# Patient Record
Sex: Female | Born: 1950 | Race: Black or African American | Hispanic: No | Smoking: Never smoker
Health system: Southern US, Community
[De-identification: ages and names within clinical notes are randomized; demographics above are authoritative.]

## PROBLEM LIST (undated history)

## (undated) DIAGNOSIS — I10 Essential (primary) hypertension: Secondary | ICD-10-CM

## (undated) DIAGNOSIS — E119 Type 2 diabetes mellitus without complications: Secondary | ICD-10-CM

---

## 2006-04-26 ENCOUNTER — Inpatient Hospital Stay (HOSPITAL_COMMUNITY): Admission: RE | Admit: 2006-04-26 | Discharge: 2006-04-30 | Payer: Self-pay | Admitting: Orthopedic Surgery

## 2007-11-02 IMAGING — CR DG HIP (WITH OR WITHOUT PELVIS) 2-3V*L*
4 series · 4 of 4 positions shown · non-contrast
Comparison: None. Progress examination left hip, 2 views.

HISTORY: Osteoarthritis left hip.

[t pelvis a.p.]
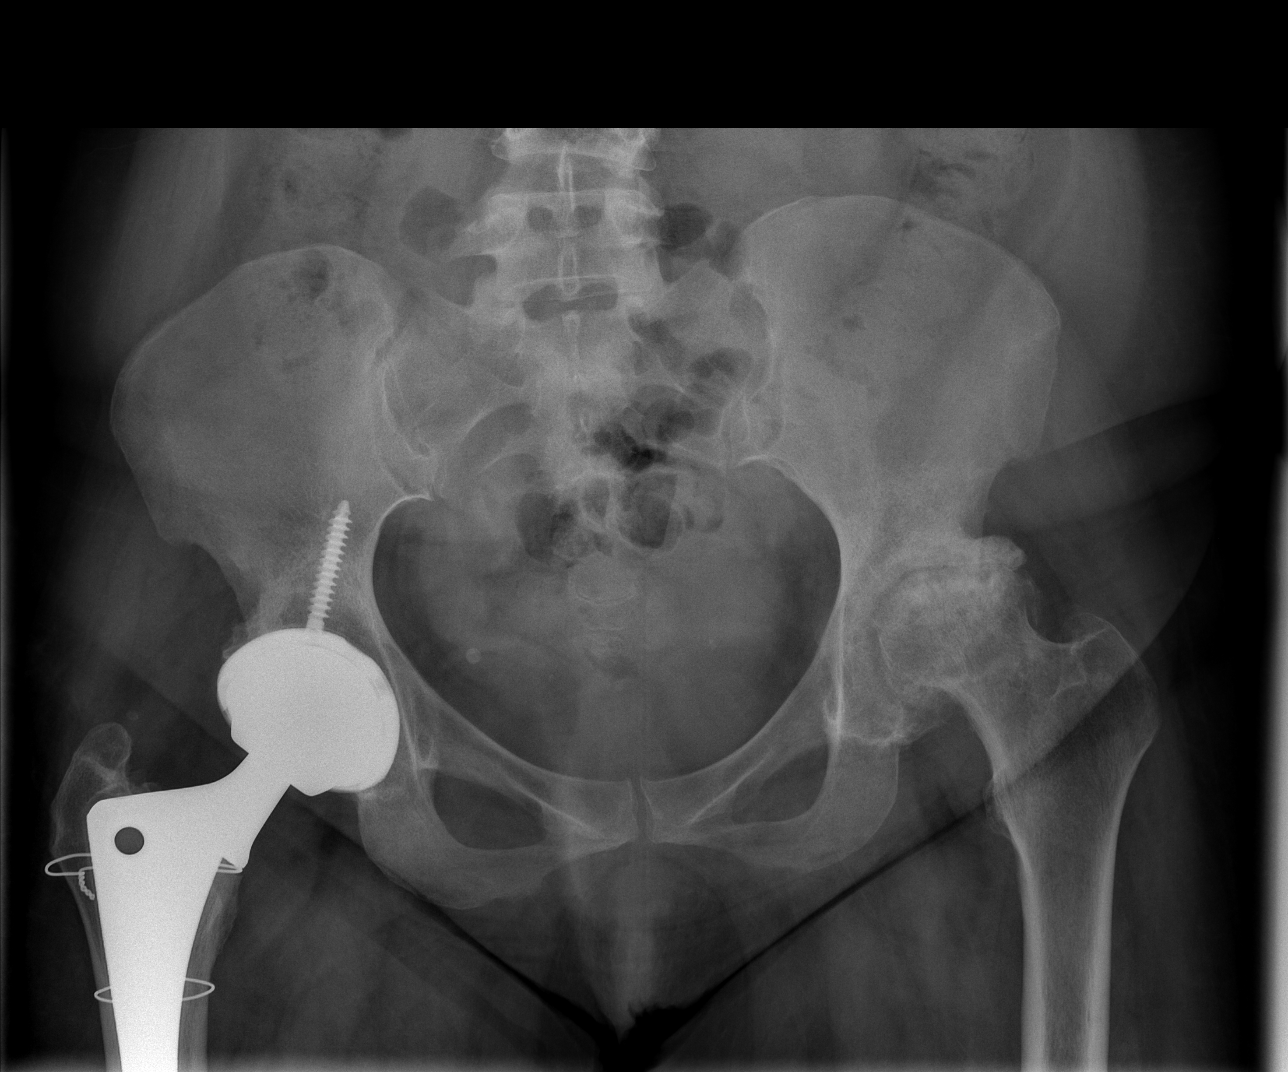

[t hip ap left]
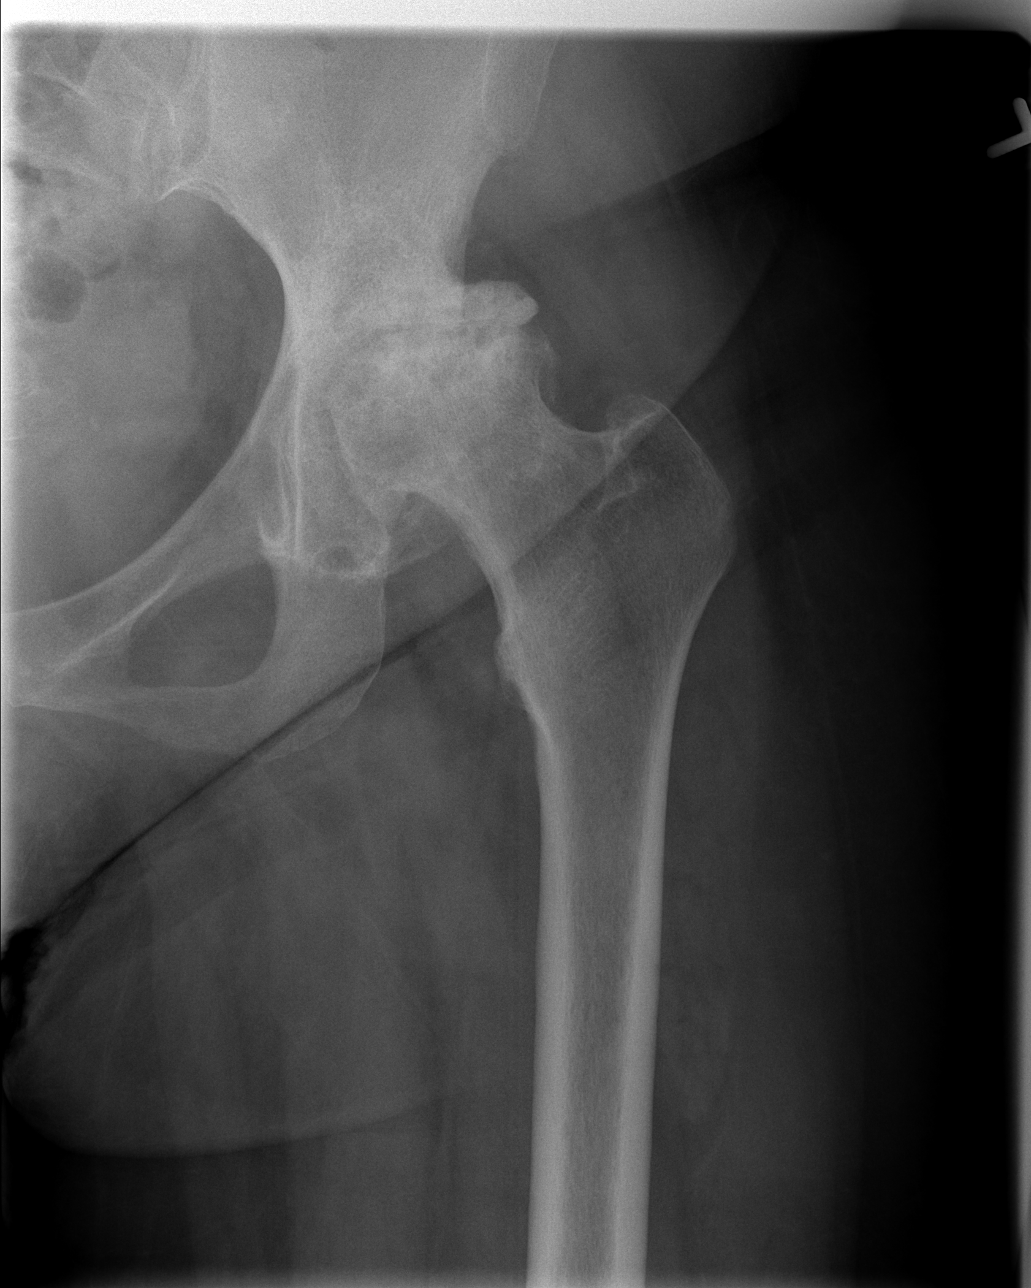

[t hip frog leg left (1 of 2)]
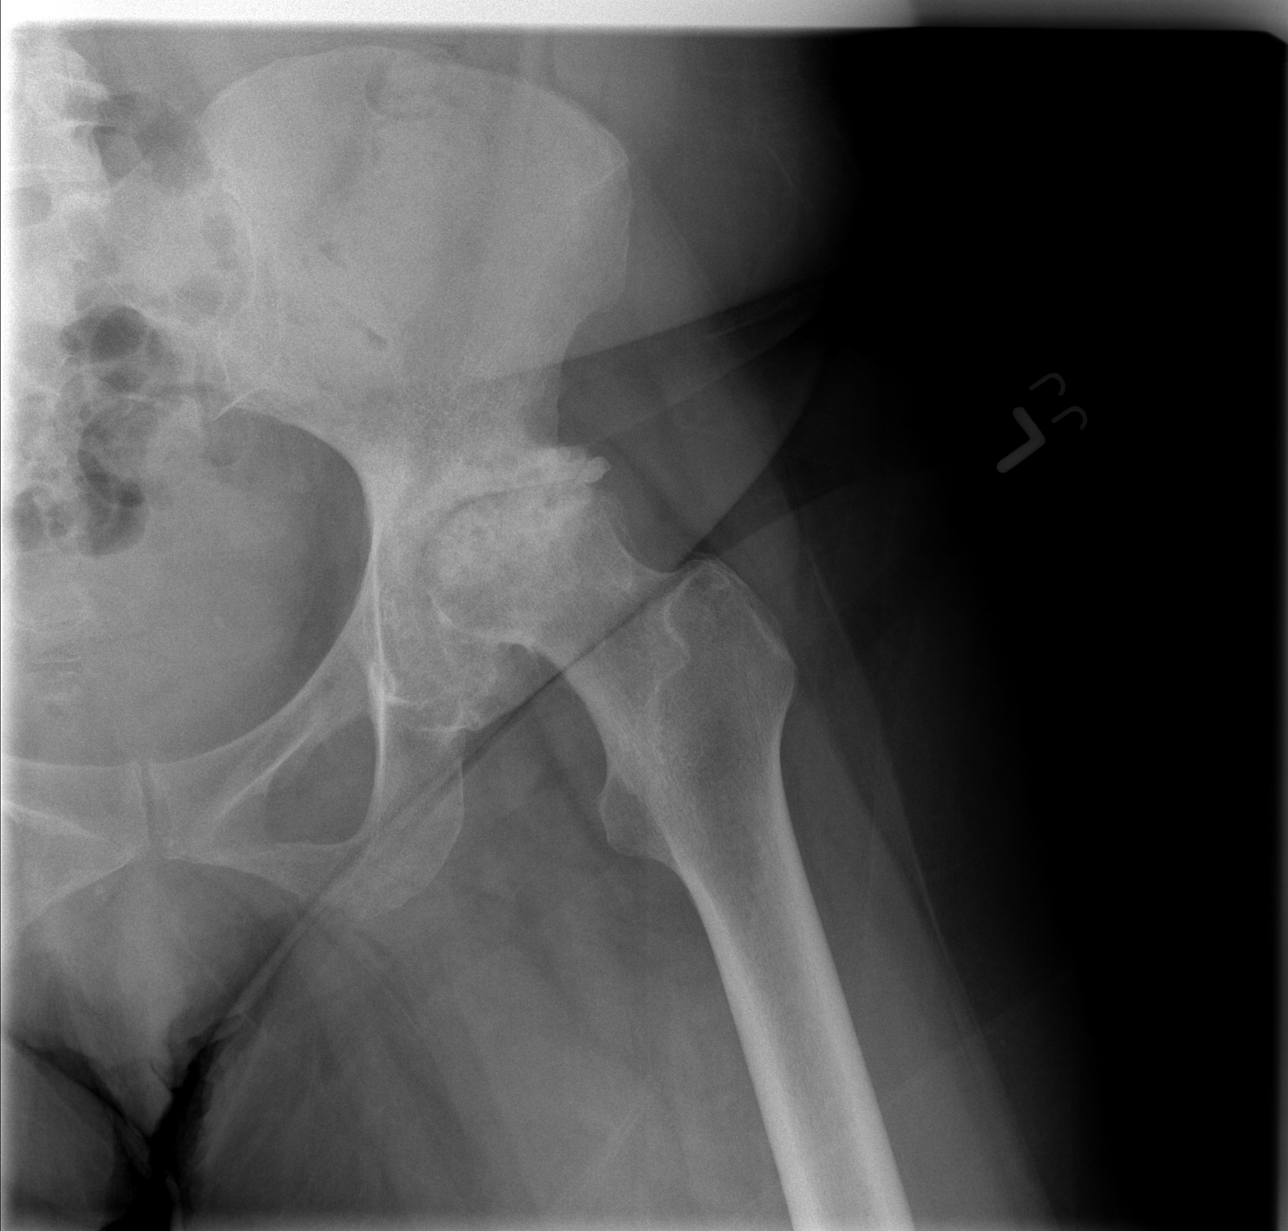

[t hip frog leg left (2 of 2)]
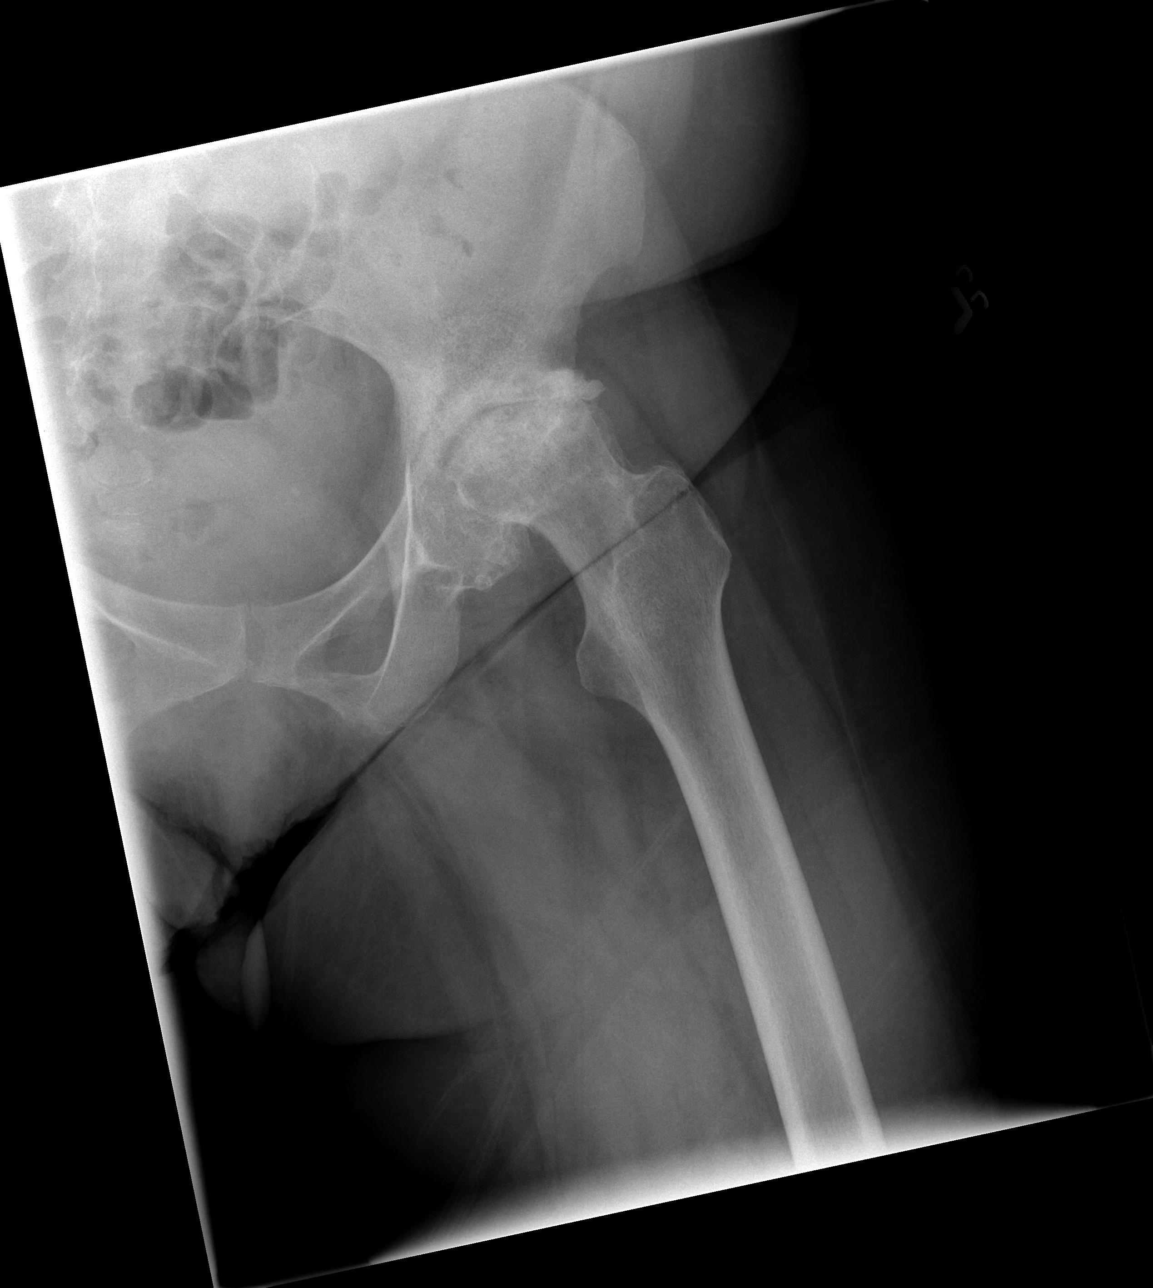

[4 of 4 positions shown; findings below may reference images not displayed]

FINDINGS: Severe degenerative joint disease is identified involving the left hip. There is
marked deformation of the left femoral head, with exuberant osteophyte
formation,  subchondral sclerosis and subchondral geode formation.

A right total hip arthroplasty device is noted. 

No complications are noted.
IMPRESSION: 1. Severe degenerative joint disease affects the left hip.

## 2007-11-09 IMAGING — CR DG PORTABLE PELVIS
1 series · 1 of 1 positions shown · non-contrast
Comparison: none

CLINICAL DATA: Total hip replacement.
 PORTABLE PELVIS ? 1 VIEW:

[view not recorded]
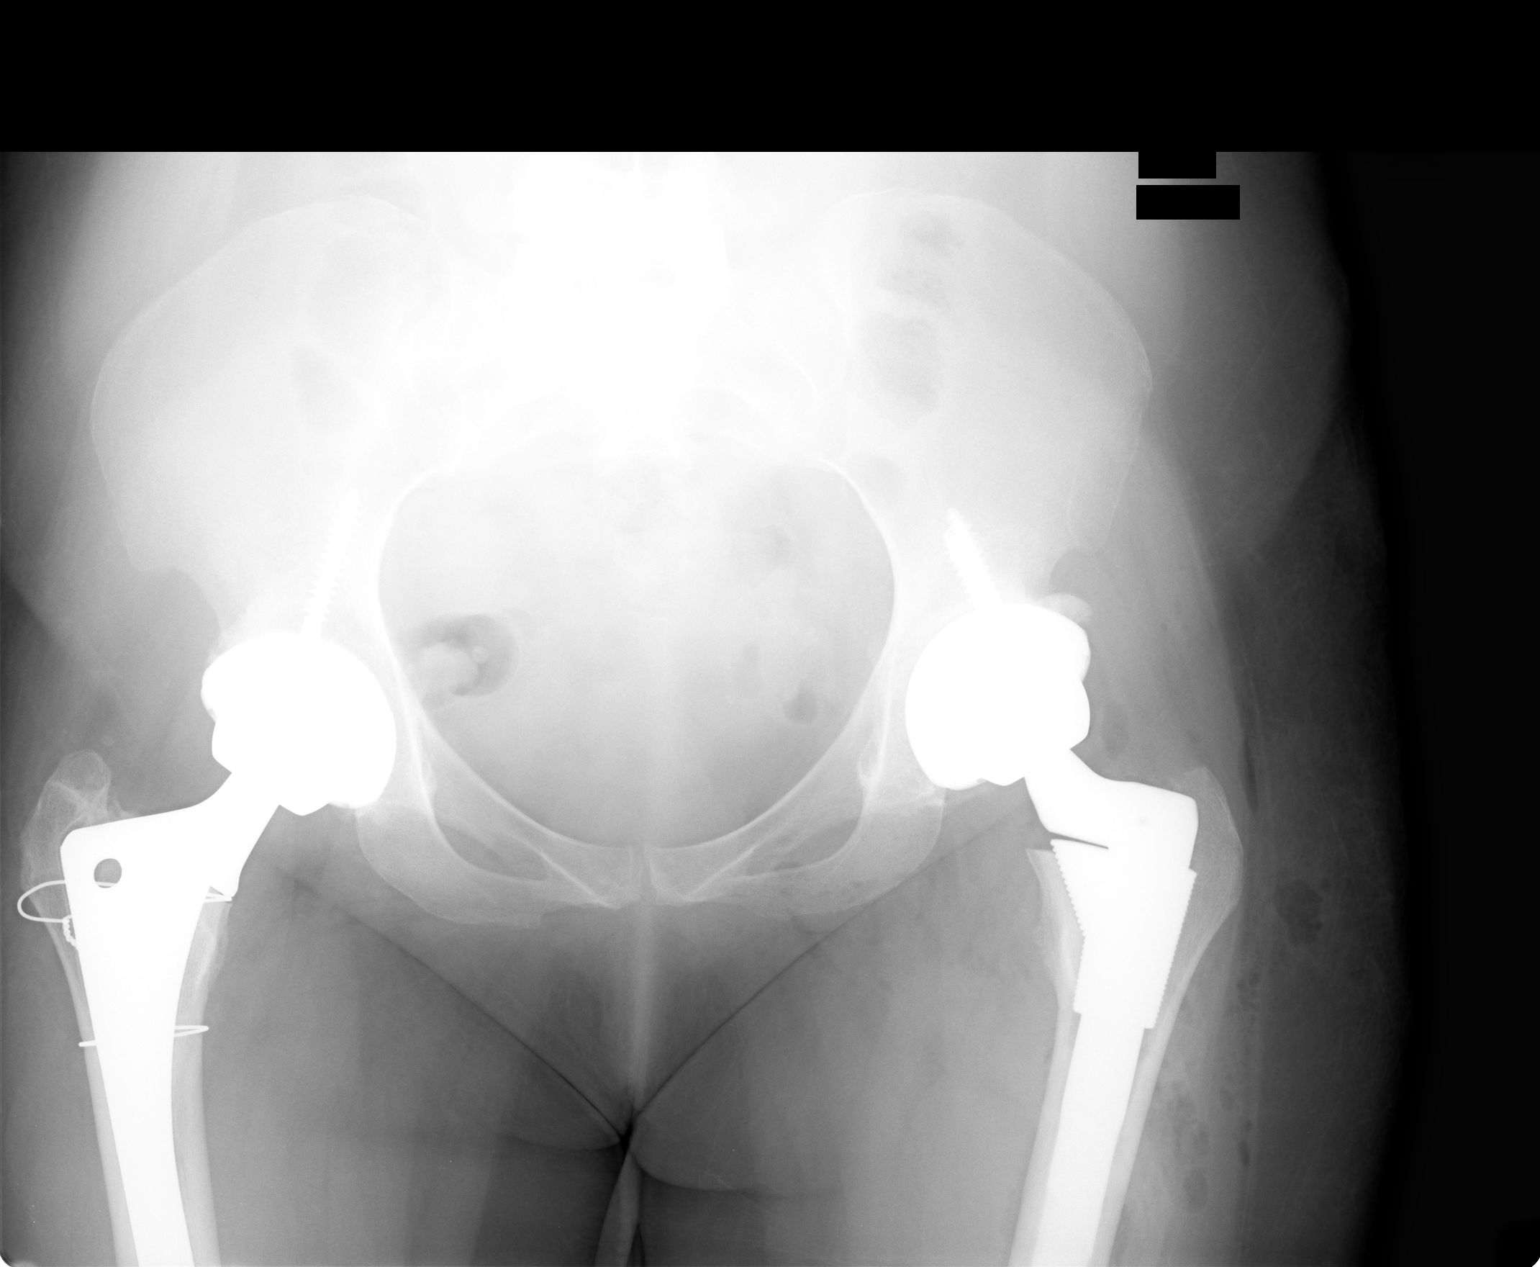

[1 of 1 positions shown; findings below may reference images not displayed]

FINDINGS: AP view of the pelvis reveals satisfactory position of the acetabulum and femoral prosthesis.
IMPRESSION: Satisfactory left total hip replacement.  
 PORTABLE LEFT HIP ? 1 VIEW:
FINDINGS: There is satisfactory position of the left acetabulum and femoral prosthesis.
IMPRESSION: Satisfactory left total hip replacement.

## 2007-11-09 IMAGING — CR DG HIP 1V PORT*L*
1 series · 1 of 1 positions shown · non-contrast
Comparison: none

CLINICAL DATA: Total hip replacement.
 PORTABLE PELVIS ? 1 VIEW:

[view not recorded]
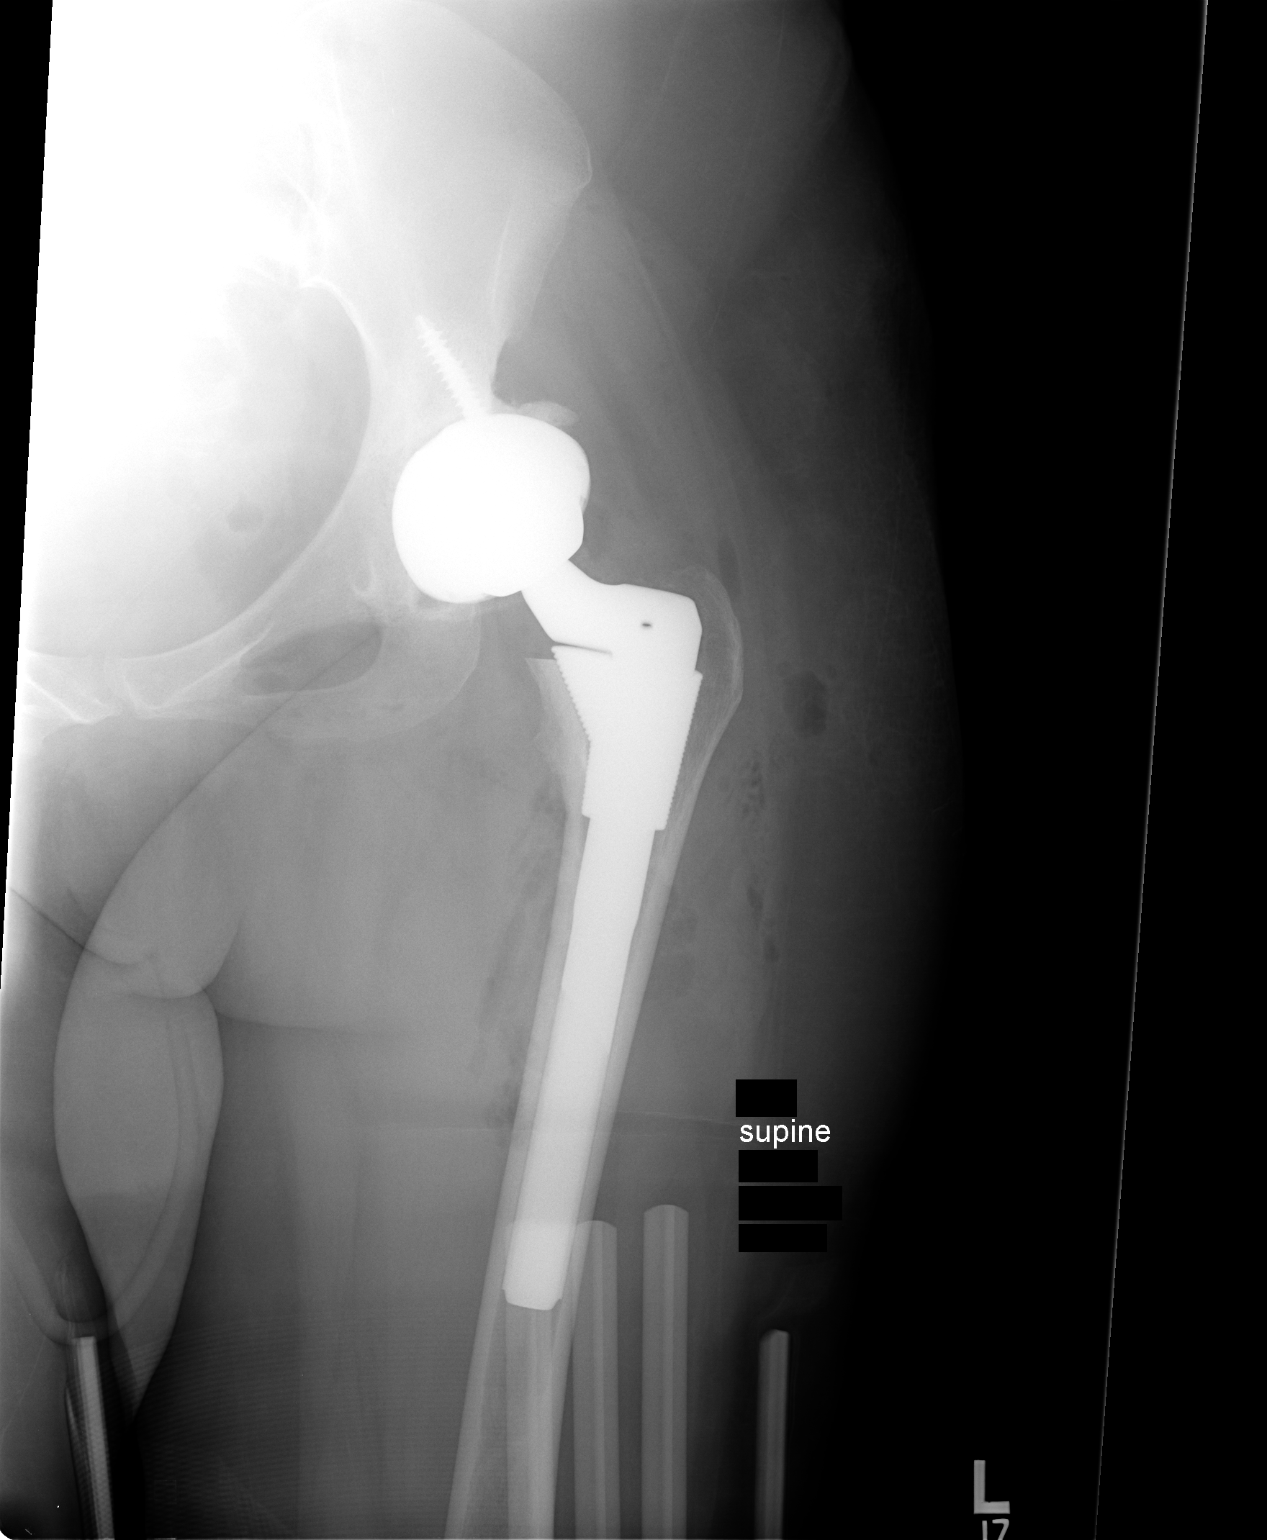

[1 of 1 positions shown; findings below may reference images not displayed]

FINDINGS: AP view of the pelvis reveals satisfactory position of the acetabulum and femoral prosthesis.
IMPRESSION: Satisfactory left total hip replacement.  
 PORTABLE LEFT HIP ? 1 VIEW:
FINDINGS: There is satisfactory position of the left acetabulum and femoral prosthesis.
IMPRESSION: Satisfactory left total hip replacement.

## 2022-01-30 ENCOUNTER — Encounter (HOSPITAL_COMMUNITY): Payer: Self-pay | Admitting: *Deleted

## 2022-01-30 ENCOUNTER — Other Ambulatory Visit: Payer: Self-pay

## 2022-01-30 ENCOUNTER — Emergency Department (HOSPITAL_COMMUNITY): Payer: No Typology Code available for payment source

## 2022-01-30 ENCOUNTER — Observation Stay (HOSPITAL_COMMUNITY)
Admission: EM | Admit: 2022-01-30 | Discharge: 2022-02-02 | Disposition: A | Discharge status: Home / Self Care | Payer: No Typology Code available for payment source | Attending: General Surgery | Admitting: General Surgery

## 2022-01-30 DIAGNOSIS — R0789 Other chest pain: Secondary | ICD-10-CM | POA: Diagnosis present

## 2022-01-30 DIAGNOSIS — R410 Disorientation, unspecified: Secondary | ICD-10-CM | POA: Insufficient documentation

## 2022-01-30 DIAGNOSIS — Y92410 Unspecified street and highway as the place of occurrence of the external cause: Secondary | ICD-10-CM | POA: Insufficient documentation

## 2022-01-30 DIAGNOSIS — S270XXA Traumatic pneumothorax, initial encounter: Secondary | ICD-10-CM | POA: Diagnosis not present

## 2022-01-30 DIAGNOSIS — E039 Hypothyroidism, unspecified: Secondary | ICD-10-CM | POA: Insufficient documentation

## 2022-01-30 DIAGNOSIS — I700 Atherosclerosis of aorta: Secondary | ICD-10-CM | POA: Insufficient documentation

## 2022-01-30 DIAGNOSIS — I10 Essential (primary) hypertension: Secondary | ICD-10-CM | POA: Diagnosis not present

## 2022-01-30 DIAGNOSIS — E119 Type 2 diabetes mellitus without complications: Secondary | ICD-10-CM | POA: Insufficient documentation

## 2022-01-30 DIAGNOSIS — T797XXA Traumatic subcutaneous emphysema, initial encounter: Secondary | ICD-10-CM | POA: Insufficient documentation

## 2022-01-30 DIAGNOSIS — S032XXA Dislocation of tooth, initial encounter: Secondary | ICD-10-CM | POA: Diagnosis not present

## 2022-01-30 DIAGNOSIS — S0993XA Unspecified injury of face, initial encounter: Secondary | ICD-10-CM

## 2022-01-30 DIAGNOSIS — Z79899 Other long term (current) drug therapy: Secondary | ICD-10-CM | POA: Diagnosis not present

## 2022-01-30 DIAGNOSIS — S01512A Laceration without foreign body of oral cavity, initial encounter: Secondary | ICD-10-CM | POA: Diagnosis not present

## 2022-01-30 DIAGNOSIS — S0101XA Laceration without foreign body of scalp, initial encounter: Secondary | ICD-10-CM

## 2022-01-30 DIAGNOSIS — J939 Pneumothorax, unspecified: Secondary | ICD-10-CM | POA: Diagnosis present

## 2022-01-30 DIAGNOSIS — Z7984 Long term (current) use of oral hypoglycemic drugs: Secondary | ICD-10-CM | POA: Diagnosis not present

## 2022-01-30 DIAGNOSIS — R262 Difficulty in walking, not elsewhere classified: Secondary | ICD-10-CM | POA: Insufficient documentation

## 2022-01-30 DIAGNOSIS — J982 Interstitial emphysema: Secondary | ICD-10-CM

## 2022-01-30 HISTORY — DX: Type 2 diabetes mellitus without complications: E11.9

## 2022-01-30 HISTORY — DX: Essential (primary) hypertension: I10

## 2022-01-30 LAB — COMPREHENSIVE METABOLIC PANEL
ALT: 63 U/L — ABNORMAL HIGH (ref 0–44)
AST: 70 U/L — ABNORMAL HIGH (ref 15–41)
Albumin: 3.9 g/dL (ref 3.5–5.0)
Alkaline Phosphatase: 59 U/L (ref 38–126)
Anion gap: 8 (ref 5–15)
BUN: 26 mg/dL — ABNORMAL HIGH (ref 8–23)
CO2: 22 mmol/L (ref 22–32)
Calcium: 9.6 mg/dL (ref 8.9–10.3)
Chloride: 107 mmol/L (ref 98–111)
Creatinine, Ser: 1.33 mg/dL — ABNORMAL HIGH (ref 0.44–1.00)
GFR, Estimated: 43 mL/min — ABNORMAL LOW (ref >60)
Glucose, Bld: 153 mg/dL — ABNORMAL HIGH (ref 70–99)
Potassium: 4 mmol/L (ref 3.5–5.1)
Sodium: 137 mmol/L (ref 135–145)
Total Bilirubin: 0.3 mg/dL (ref 0.3–1.2)
Total Protein: 6.9 g/dL (ref 6.5–8.1)

## 2022-01-30 LAB — I-STAT CHEM 8, ED
BUN: 27 mg/dL — ABNORMAL HIGH (ref 8–23)
Calcium, Ion: 1.23 mmol/L (ref 1.15–1.40)
Chloride: 107 mmol/L (ref 98–111)
Creatinine, Ser: 1.3 mg/dL — ABNORMAL HIGH (ref 0.44–1.00)
Glucose, Bld: 156 mg/dL — ABNORMAL HIGH (ref 70–99)
HCT: 30 % — ABNORMAL LOW (ref 36.0–46.0)
Hemoglobin: 10.2 g/dL — ABNORMAL LOW (ref 12.0–15.0)
Potassium: 4 mmol/L (ref 3.5–5.1)
Sodium: 141 mmol/L (ref 135–145)
TCO2: 21 mmol/L — ABNORMAL LOW (ref 22–32)

## 2022-01-30 LAB — GLUCOSE, CAPILLARY
Glucose-Capillary: 128 mg/dL — ABNORMAL HIGH (ref 70–99)
Glucose-Capillary: 153 mg/dL — ABNORMAL HIGH (ref 70–99)
Glucose-Capillary: 158 mg/dL — ABNORMAL HIGH (ref 70–99)

## 2022-01-30 LAB — PROTIME-INR
INR: 1 (ref 0.8–1.2)
Prothrombin Time: 12.5 seconds (ref 11.4–15.2)

## 2022-01-30 LAB — CBC
HCT: 32.8 % — ABNORMAL LOW (ref 36.0–46.0)
Hemoglobin: 11 g/dL — ABNORMAL LOW (ref 12.0–15.0)
MCH: 28.8 pg (ref 26.0–34.0)
MCHC: 33.5 g/dL (ref 30.0–36.0)
MCV: 85.9 fL (ref 80.0–100.0)
Platelets: 200 10*3/uL (ref 150–400)
RBC: 3.82 MIL/uL — ABNORMAL LOW (ref 3.87–5.11)
RDW: 13.9 % (ref 11.5–15.5)
WBC: 10.6 10*3/uL — ABNORMAL HIGH (ref 4.0–10.5)
nRBC: 0 % (ref 0.0–0.2)

## 2022-01-30 LAB — ETHANOL: Alcohol, Ethyl (B): <10 mg/dL (ref <10)

## 2022-01-30 LAB — CBG MONITORING, ED: Glucose-Capillary: 159 mg/dL — ABNORMAL HIGH (ref 70–99)

## 2022-01-30 LAB — LACTIC ACID, PLASMA: Lactic Acid, Venous: 2.3 mmol/L (ref 0.5–1.9)

## 2022-01-30 LAB — SAMPLE TO BLOOD BANK

## 2022-01-30 MED ORDER — OXYCODONE HCL 5 MG PO TABS
2.5000 mg | ORAL_TABLET | ORAL | Status: DC | PRN
  Administered 2022-01-31 – 2022-02-02 (×8): 5 mg via ORAL
  Filled 2022-01-30 (×8): qty 1

## 2022-01-30 MED ORDER — SODIUM CHLORIDE 0.9% FLUSH: 3.0000 mL | INTRAVENOUS | Status: DC | PRN

## 2022-01-30 MED ORDER — HYDRALAZINE HCL 20 MG/ML IJ SOLN: 10.0000 mg | INTRAMUSCULAR | Status: DC | PRN

## 2022-01-30 MED ORDER — MORPHINE SULFATE (PF) 2 MG/ML IV SOLN: 2.0000 mg | INTRAVENOUS | Status: DC | PRN

## 2022-01-30 MED ORDER — SODIUM CHLORIDE 0.9% FLUSH
3.0000 mL | Freq: Two times a day (BID) | INTRAVENOUS | Status: DC
  Administered 2022-01-30 – 2022-02-02 (×5): 3 mL via INTRAVENOUS

## 2022-01-30 MED ORDER — ACETAMINOPHEN 500 MG PO TABS
1000.0000 mg | ORAL_TABLET | Freq: Four times a day (QID) | ORAL | Status: DC | PRN
  Administered 2022-01-30 – 2022-01-31 (×2): 1000 mg via ORAL
  Filled 2022-01-30 (×2): qty 2

## 2022-01-30 MED ORDER — DOCUSATE SODIUM 100 MG PO CAPS
100.0000 mg | ORAL_CAPSULE | Freq: Two times a day (BID) | ORAL | Status: DC
  Administered 2022-01-31 – 2022-02-02 (×5): 100 mg via ORAL
  Filled 2022-01-30 (×5): qty 1

## 2022-01-30 MED ORDER — ONDANSETRON HCL 4 MG/2ML IJ SOLN: 4.0000 mg | Freq: Four times a day (QID) | INTRAMUSCULAR | Status: DC | PRN

## 2022-01-30 MED ORDER — METHOCARBAMOL 500 MG PO TABS
500.0000 mg | ORAL_TABLET | Freq: Three times a day (TID) | ORAL | Status: DC | PRN
  Administered 2022-01-31: 500 mg via ORAL
  Filled 2022-01-30 (×2): qty 1

## 2022-01-30 MED ORDER — SODIUM CHLORIDE 0.9 % IV SOLN
250.0000 mL | INTRAVENOUS | Status: DC | PRN
  Administered 2022-01-30: 250 mL via INTRAVENOUS

## 2022-01-30 MED ORDER — ONDANSETRON HCL 4 MG/2ML IJ SOLN
4.0000 mg | Freq: Four times a day (QID) | INTRAMUSCULAR | Status: DC | PRN
  Administered 2022-01-30: 4 mg via INTRAVENOUS
  Filled 2022-01-30: qty 2

## 2022-01-30 MED ORDER — LIDOCAINE-EPINEPHRINE (PF) 2 %-1:200000 IJ SOLN
20.0000 mL | Freq: Once | INTRAMUSCULAR | Status: AC
  Administered 2022-01-30: 20 mL via INTRADERMAL
  Filled 2022-01-30: qty 20

## 2022-01-30 MED ORDER — ENOXAPARIN SODIUM 30 MG/0.3ML IJ SOSY
30.0000 mg | PREFILLED_SYRINGE | Freq: Two times a day (BID) | INTRAMUSCULAR | Status: DC
  Administered 2022-01-31 – 2022-02-02 (×5): 30 mg via SUBCUTANEOUS
  Filled 2022-01-30 (×5): qty 0.3

## 2022-01-30 MED ORDER — INSULIN ASPART 100 UNIT/ML IJ SOLN
0.0000 [IU] | Freq: Three times a day (TID) | INTRAMUSCULAR | Status: DC
  Administered 2022-01-30 – 2022-01-31 (×2): 3 [IU] via SUBCUTANEOUS
  Administered 2022-01-31 (×2): 2 [IU] via SUBCUTANEOUS
  Administered 2022-02-01 – 2022-02-02 (×3): 3 [IU] via SUBCUTANEOUS

## 2022-01-30 MED ORDER — POLYETHYLENE GLYCOL 3350 17 G PO PACK
17.0000 g | PACK | Freq: Every day | ORAL | Status: DC
  Administered 2022-01-31 – 2022-02-02 (×3): 17 g via ORAL
  Filled 2022-01-30 (×3): qty 1

## 2022-01-30 MED ORDER — FENTANYL CITRATE PF 50 MCG/ML IJ SOSY
25.0000 ug | PREFILLED_SYRINGE | Freq: Once | INTRAMUSCULAR | Status: AC
  Administered 2022-01-30: 25 ug via INTRAVENOUS
  Filled 2022-01-30: qty 1

## 2022-01-30 MED ORDER — MELATONIN 3 MG PO TABS: 3.0000 mg | ORAL_TABLET | Freq: Every evening | ORAL | Status: DC | PRN

## 2022-01-30 MED ORDER — IOHEXOL 350 MG/ML SOLN
75.0000 mL | Freq: Once | INTRAVENOUS | Status: AC | PRN
  Administered 2022-01-30: 75 mL via INTRAVENOUS

## 2022-01-30 MED ORDER — METHOCARBAMOL 1000 MG/10ML IJ SOLN
500.0000 mg | Freq: Three times a day (TID) | INTRAVENOUS | Status: DC | PRN
  Administered 2022-01-30: 500 mg via INTRAVENOUS
  Filled 2022-01-30: qty 5

## 2022-01-30 MED ORDER — ORAL CARE MOUTH RINSE: 15.0000 mL | OROMUCOSAL | Status: DC | PRN

## 2022-01-30 NOTE — ED Notes (Signed)
Spoke with Sharee Pimple on [facility] [address] will accept patient .
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
## 2022-01-30 NOTE — ED Notes (Signed)
Patient has small laceration to the left side of her tongue , left upper 2nd front tooth knocked out.
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
## 2022-01-30 NOTE — Progress Notes (Signed)
Orthopedic Tech Progress Note Patient Details:  Wendy Kaiser 10-05-1950 [id number]  Patient ID: Wendy Kaiser, female   DOB: 01-Feb-1950, 72 y.o.   MRN: [id number] Level II; not currently needed. [name] 01/30/2022, 10:58 AM

## 2022-01-30 NOTE — ED Notes (Signed)
Returned from CT.

## 2022-01-31 ENCOUNTER — Observation Stay (HOSPITAL_COMMUNITY): Payer: No Typology Code available for payment source

## 2022-01-31 LAB — CBC
HCT: 31.5 % — ABNORMAL LOW (ref 36.0–46.0)
Hemoglobin: 10.4 g/dL — ABNORMAL LOW (ref 12.0–15.0)
MCH: 28.4 pg (ref 26.0–34.0)
MCHC: 33 g/dL (ref 30.0–36.0)
MCV: 86.1 fL (ref 80.0–100.0)
Platelets: 175 10*3/uL (ref 150–400)
RBC: 3.66 MIL/uL — ABNORMAL LOW (ref 3.87–5.11)
RDW: 14 % (ref 11.5–15.5)
WBC: 8.6 10*3/uL (ref 4.0–10.5)
nRBC: 0 % (ref 0.0–0.2)

## 2022-01-31 LAB — BASIC METABOLIC PANEL
Anion gap: 11 (ref 5–15)
BUN: 23 mg/dL (ref 8–23)
CO2: 23 mmol/L (ref 22–32)
Calcium: 9.8 mg/dL (ref 8.9–10.3)
Chloride: 103 mmol/L (ref 98–111)
Creatinine, Ser: 1.32 mg/dL — ABNORMAL HIGH (ref 0.44–1.00)
GFR, Estimated: 43 mL/min — ABNORMAL LOW (ref >60)
Glucose, Bld: 153 mg/dL — ABNORMAL HIGH (ref 70–99)
Potassium: 4.1 mmol/L (ref 3.5–5.1)
Sodium: 137 mmol/L (ref 135–145)

## 2022-01-31 LAB — GLUCOSE, CAPILLARY
Glucose-Capillary: 138 mg/dL — ABNORMAL HIGH (ref 70–99)
Glucose-Capillary: 161 mg/dL — ABNORMAL HIGH (ref 70–99)
Glucose-Capillary: 121 mg/dL — ABNORMAL HIGH (ref 70–99)
Glucose-Capillary: 180 mg/dL — ABNORMAL HIGH (ref 70–99)

## 2022-01-31 LAB — HEMOGLOBIN A1C
Hgb A1c MFr Bld: 6.5 % — ABNORMAL HIGH (ref 4.8–5.6)
Mean Plasma Glucose: 139.85 mg/dL

## 2022-01-31 LAB — HIV ANTIBODY (ROUTINE TESTING W REFLEX): HIV Screen 4th Generation wRfx: NONREACTIVE
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
## 2022-02-01 LAB — GLUCOSE, CAPILLARY
Glucose-Capillary: 159 mg/dL — ABNORMAL HIGH (ref 70–99)
Glucose-Capillary: 186 mg/dL — ABNORMAL HIGH (ref 70–99)
Glucose-Capillary: 82 mg/dL (ref 70–99)
Glucose-Capillary: 223 mg/dL — ABNORMAL HIGH (ref 70–99)

## 2022-02-01 MED ORDER — INSULIN ASPART 100 UNIT/ML IJ SOLN
0.0000 [IU] | Freq: Every day | INTRAMUSCULAR | Status: DC
  Administered 2022-02-01: 2 [IU] via SUBCUTANEOUS

## 2022-02-01 NOTE — Plan of Care (Signed)
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
## 2022-02-02 LAB — GLUCOSE, CAPILLARY
Glucose-Capillary: 108 mg/dL — ABNORMAL HIGH (ref 70–99)
Glucose-Capillary: 172 mg/dL — ABNORMAL HIGH (ref 70–99)

## 2022-02-02 MED ORDER — OXYCODONE HCL 5 MG PO TABS: 2.5000 mg | ORAL_TABLET | Freq: Four times a day (QID) | ORAL | 0 refills | Status: AC | PRN

## 2022-02-02 MED ORDER — POLYETHYLENE GLYCOL 3350 17 G PO PACK: 17.0000 g | PACK | Freq: Every day | ORAL | 0 refills | Status: AC | PRN

## 2022-02-02 MED ORDER — DOCUSATE SODIUM 100 MG PO CAPS: 100.0000 mg | ORAL_CAPSULE | Freq: Two times a day (BID) | ORAL | 0 refills | Status: AC

## 2022-02-02 MED ORDER — ACETAMINOPHEN 500 MG PO TABS: 1000.0000 mg | ORAL_TABLET | Freq: Four times a day (QID) | ORAL | 0 refills | Status: AC | PRN
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
## 2022-02-02 NOTE — Progress Notes (Signed)
Mobility Specialist - Progress Note   02/02/22 1000  Mobility  Activity Ambulated with assistance in hallway  Level of Assistance Standby assist, set-up cues, supervision of patient - no hands on  Assistive Device None  Distance Ambulated (ft) 500 ft  Activity Response Tolerated well  Mobility Referral Yes  $Mobility charge 1 Mobility    Pt received in room agreeable to mobility. No complaints throughout. Left in room w/ all needs met.   [place] Specialist Please contact via SecureChat or Rehab office at [phone]
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
